# Patient Record
Sex: Male | Born: 1985 | Hispanic: Yes | Marital: Married
Health system: Southern US, Community
[De-identification: ages and names within clinical notes are randomized; demographics above are authoritative.]

---

## 2006-08-12 ENCOUNTER — Emergency Department: Payer: Self-pay | Admitting: Emergency Medicine

## 2006-12-24 ENCOUNTER — Emergency Department: Payer: Self-pay | Admitting: Internal Medicine

## 2007-07-29 ENCOUNTER — Emergency Department: Payer: Self-pay

## 2008-02-09 ENCOUNTER — Emergency Department: Payer: Self-pay | Admitting: Emergency Medicine

## 2008-08-24 ENCOUNTER — Emergency Department: Payer: Self-pay | Admitting: Emergency Medicine

## 2008-11-17 ENCOUNTER — Emergency Department: Payer: Self-pay | Admitting: Emergency Medicine

## 2010-08-28 IMAGING — CR DG THORACIC SPINE 2-3V
1 series · 3 of 3 positions shown · non-contrast
Comparison: none

REASON FOR EXAM: PT FELL AT WORK AND INJURED MID BACK AND SHOULDER
COMMENTS:

[Series 1: view not recorded · 0.17mm/px · 3 of 3 slices shown]
[im 1/3]
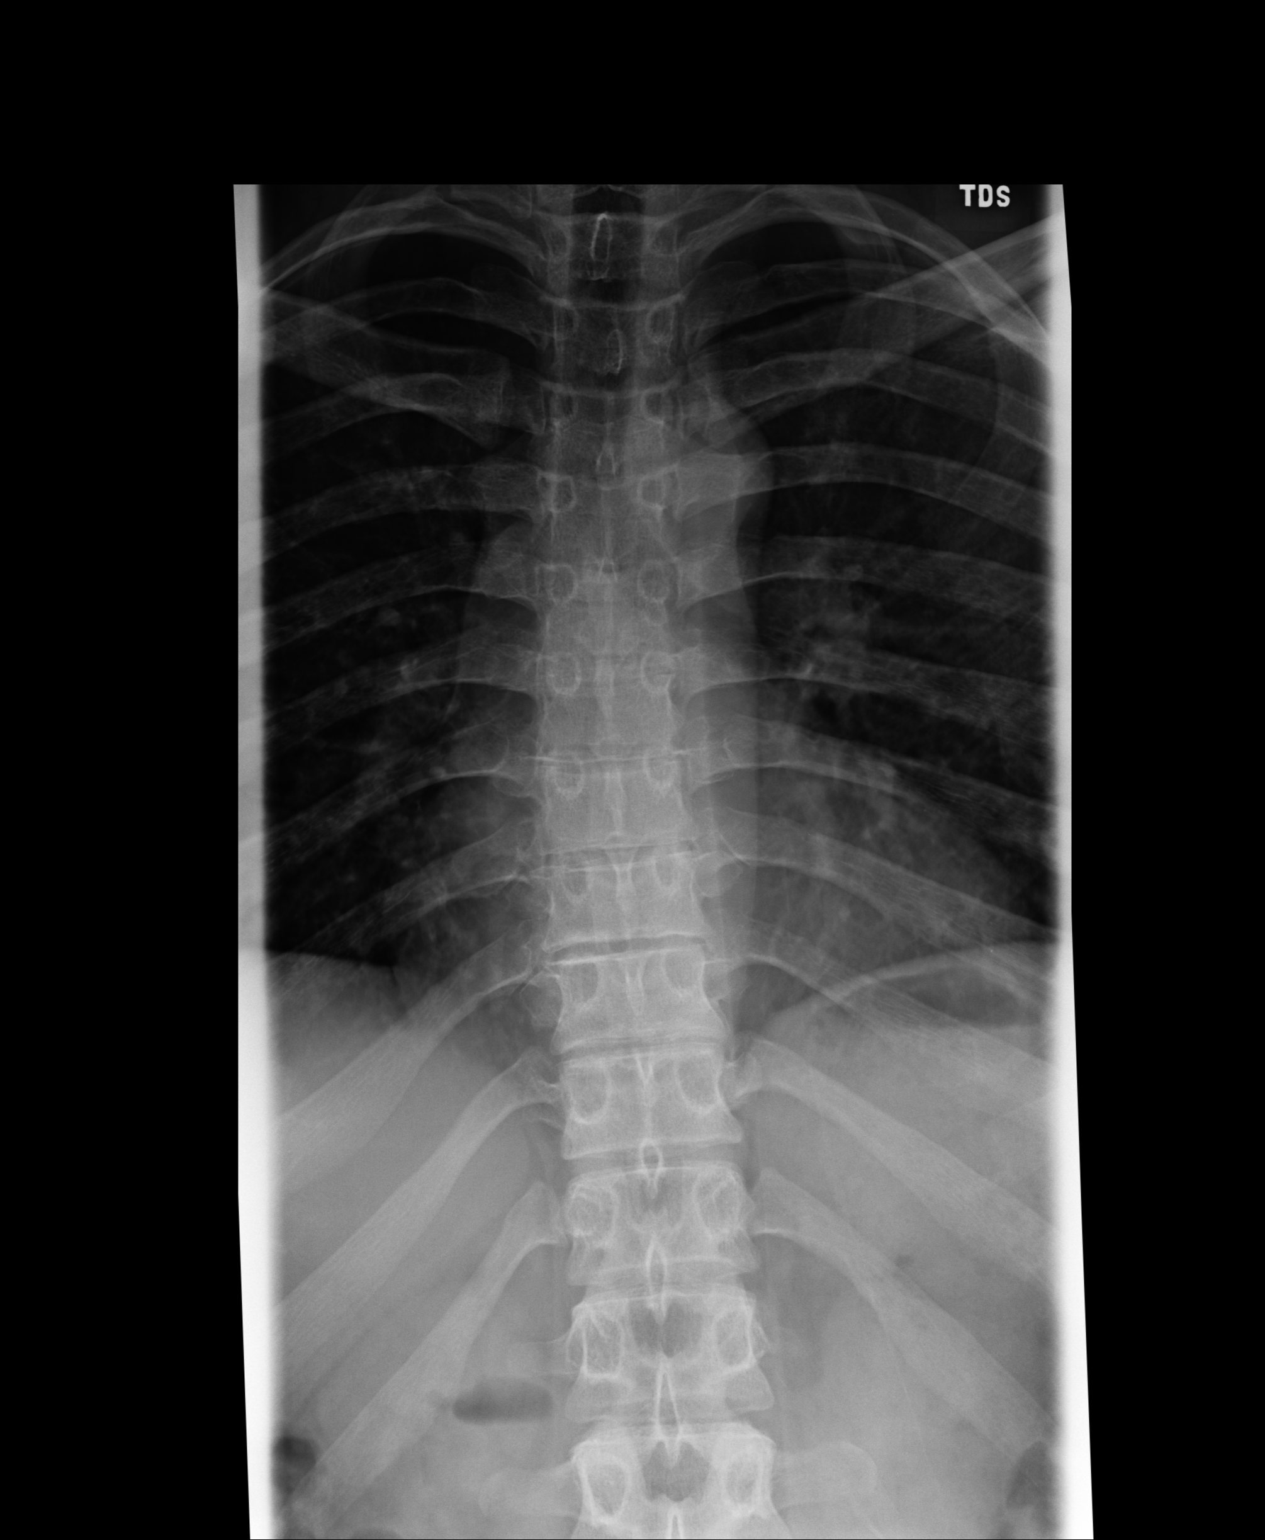
[im 2/3]
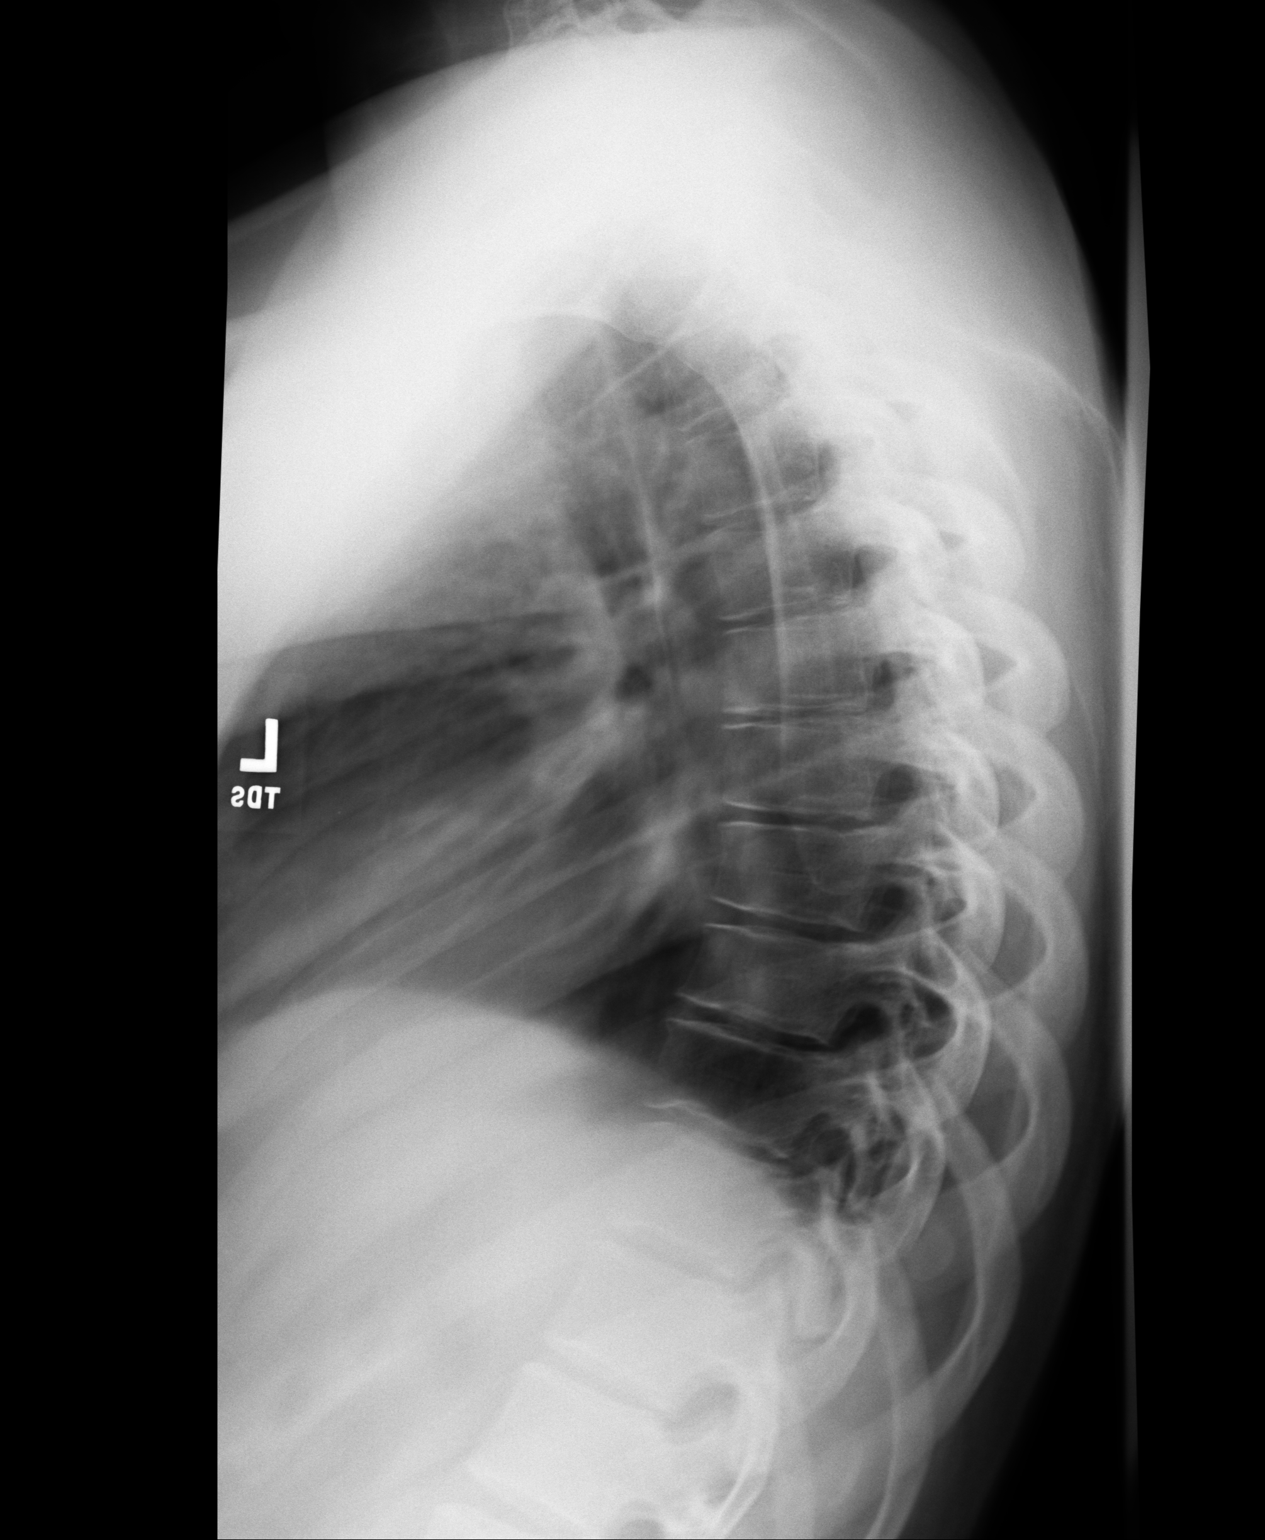
[im 3/3]
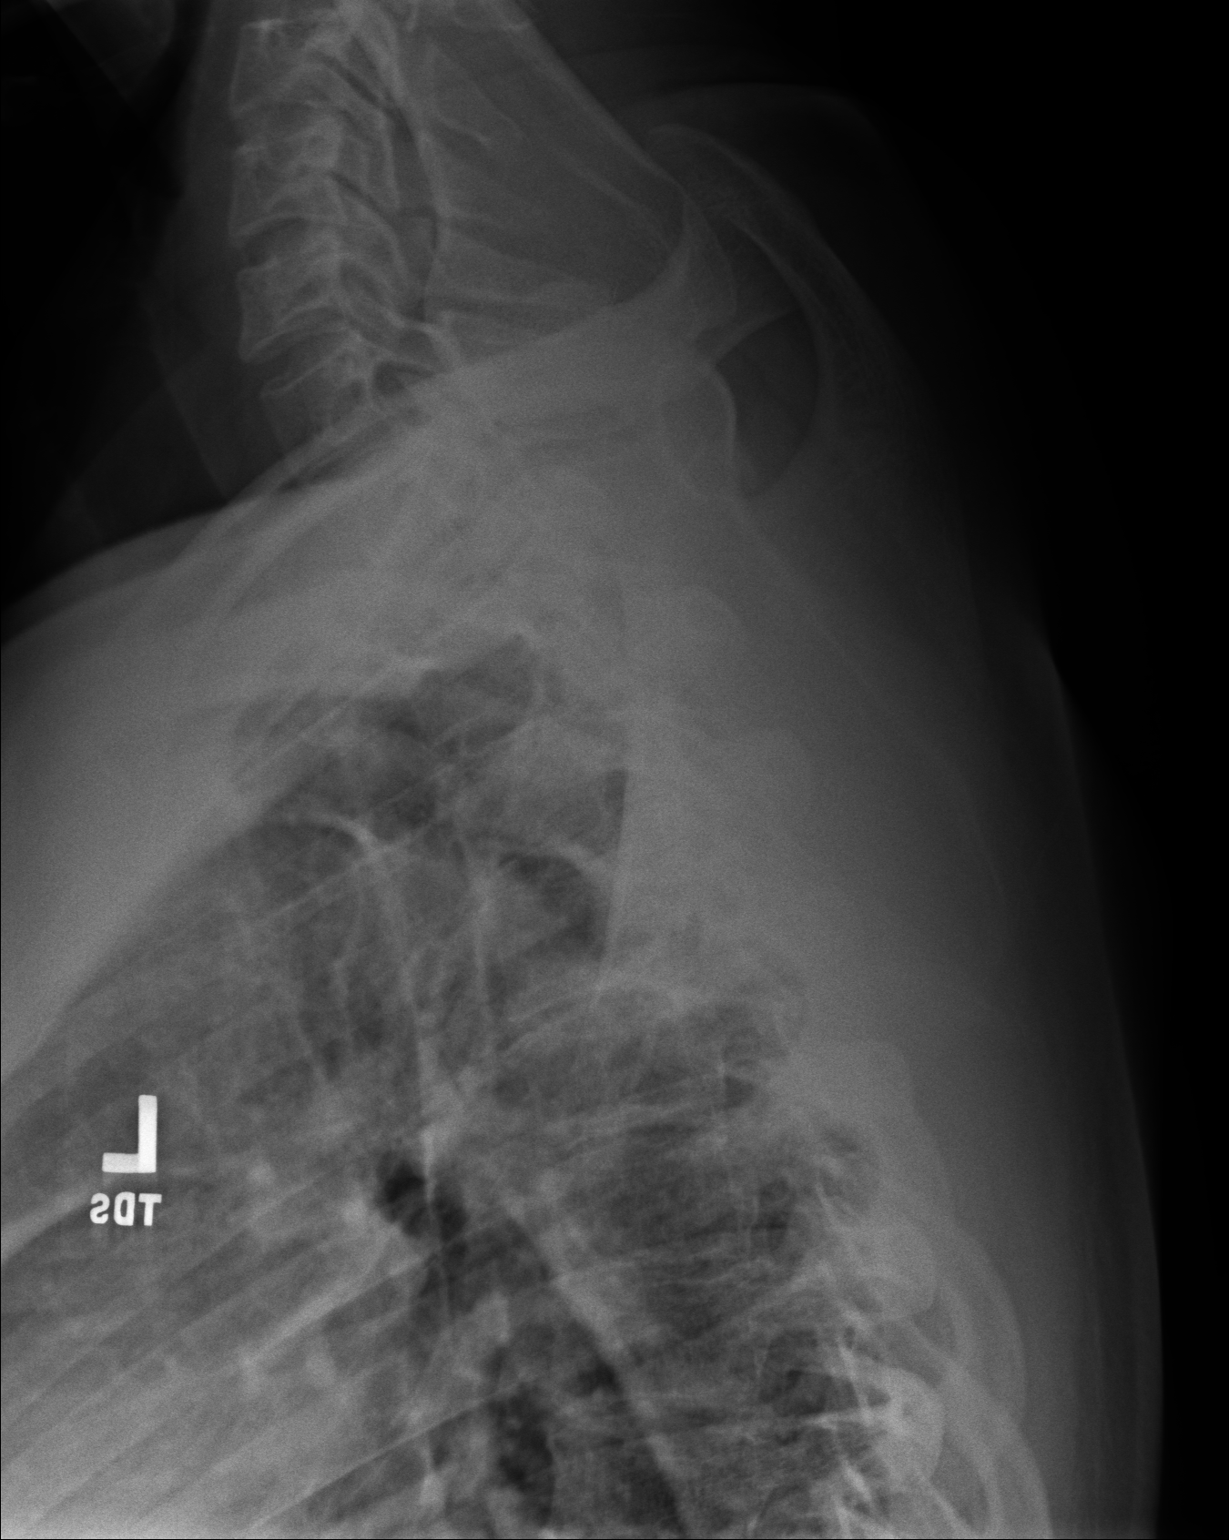

[3 of 3 positions shown; findings below may reference images not displayed]

PROCEDURE:     DXR - DXR THORACIC  AP AND LATERAL  - August 24, 2008  [DATE]

RESULT:     AP and lateral views of the thoracic spine reveal the vertebral
bodies to be preserved in height. Very gentle curvature convex toward the
RIGHT is present which is likely positional. No abnormal paravertebral soft
tissue densities are seen.
IMPRESSION: I see no acute bony abnormality of the thoracic spine.
Follow-up imaging is available if the patient's symptoms warrant this.

## 2020-12-25 ENCOUNTER — Ambulatory Visit
Admission: EM | Admit: 2020-12-25 | Discharge: 2020-12-25 | Disposition: A | Discharge status: Home / Self Care | Payer: HRSA Program | Attending: Physician Assistant | Admitting: Physician Assistant

## 2020-12-25 ENCOUNTER — Other Ambulatory Visit: Payer: Self-pay

## 2020-12-25 DIAGNOSIS — Z20822 Contact with and (suspected) exposure to covid-19: Secondary | ICD-10-CM | POA: Diagnosis present

## 2020-12-25 NOTE — ED Triage Notes (Signed)
Patient here for COVID testing, positive exposure, no symptoms 

## 2020-12-25 NOTE — Discharge Instructions (Signed)

## 2020-12-26 LAB — SARS CORONAVIRUS 2 (TAT 6-24 HRS): SARS Coronavirus 2: NEGATIVE
